# Patient Record
Sex: Female | Born: 1970 | Race: White | Hispanic: No | Marital: Married
Health system: Southern US, Community
[De-identification: ages and names within clinical notes are randomized; demographics above are authoritative.]

## PROBLEM LIST (undated history)

## (undated) HISTORY — PX: ABDOMINAL HYSTERECTOMY: SHX81

## (undated) HISTORY — PX: DIAGNOSTIC LAPAROSCOPY: SUR761

---

## 2004-08-17 ENCOUNTER — Emergency Department: Payer: Self-pay | Admitting: Internal Medicine

## 2005-05-17 ENCOUNTER — Emergency Department: Payer: Self-pay | Admitting: General Practice

## 2006-11-12 ENCOUNTER — Ambulatory Visit: Payer: Self-pay

## 2006-12-25 ENCOUNTER — Inpatient Hospital Stay: Payer: Self-pay | Admitting: Obstetrics and Gynecology

## 2008-08-03 ENCOUNTER — Ambulatory Visit: Payer: Self-pay | Admitting: Obstetrics and Gynecology

## 2008-08-04 ENCOUNTER — Inpatient Hospital Stay: Payer: Self-pay

## 2014-04-03 ENCOUNTER — Ambulatory Visit: Payer: Self-pay | Admitting: Internal Medicine

## 2017-12-20 ENCOUNTER — Other Ambulatory Visit: Payer: Self-pay | Admitting: Obstetrics & Gynecology

## 2017-12-20 DIAGNOSIS — Z1231 Encounter for screening mammogram for malignant neoplasm of breast: Secondary | ICD-10-CM

## 2017-12-25 ENCOUNTER — Ambulatory Visit
Admission: RE | Admit: 2017-12-25 | Discharge: 2017-12-25 | Disposition: A | Discharge status: Home / Self Care | Payer: BLUE CROSS/BLUE SHIELD | Source: Ambulatory Visit | Attending: Obstetrics & Gynecology | Admitting: Obstetrics & Gynecology

## 2017-12-25 ENCOUNTER — Encounter (INDEPENDENT_AMBULATORY_CARE_PROVIDER_SITE_OTHER): Payer: Self-pay

## 2017-12-25 DIAGNOSIS — Z1231 Encounter for screening mammogram for malignant neoplasm of breast: Secondary | ICD-10-CM | POA: Diagnosis present

## 2017-12-30 ENCOUNTER — Other Ambulatory Visit: Payer: Self-pay | Admitting: Obstetrics & Gynecology

## 2017-12-30 DIAGNOSIS — N6489 Other specified disorders of breast: Secondary | ICD-10-CM

## 2017-12-30 DIAGNOSIS — R928 Other abnormal and inconclusive findings on diagnostic imaging of breast: Secondary | ICD-10-CM

## 2018-01-09 ENCOUNTER — Ambulatory Visit
Admission: RE | Admit: 2018-01-09 | Discharge: 2018-01-09 | Disposition: A | Discharge status: Home / Self Care | Payer: BLUE CROSS/BLUE SHIELD | Source: Ambulatory Visit | Attending: Obstetrics & Gynecology | Admitting: Obstetrics & Gynecology

## 2018-01-09 DIAGNOSIS — N6489 Other specified disorders of breast: Secondary | ICD-10-CM | POA: Insufficient documentation

## 2018-01-09 DIAGNOSIS — R928 Other abnormal and inconclusive findings on diagnostic imaging of breast: Secondary | ICD-10-CM

## 2018-01-09 DIAGNOSIS — N6323 Unspecified lump in the left breast, lower outer quadrant: Secondary | ICD-10-CM | POA: Insufficient documentation

## 2018-01-09 DIAGNOSIS — N6321 Unspecified lump in the left breast, upper outer quadrant: Secondary | ICD-10-CM | POA: Diagnosis not present

## 2018-06-11 ENCOUNTER — Encounter: Payer: Self-pay | Admitting: Anesthesiology

## 2018-06-11 ENCOUNTER — Other Ambulatory Visit: Payer: Self-pay

## 2018-06-11 ENCOUNTER — Ambulatory Visit: Payer: BLUE CROSS/BLUE SHIELD | Admitting: Anesthesiology

## 2018-06-11 ENCOUNTER — Observation Stay
Admission: AD | Admit: 2018-06-11 | Discharge: 2018-06-12 | Disposition: A | Discharge status: Home / Self Care | Payer: BLUE CROSS/BLUE SHIELD | Source: Ambulatory Visit | Attending: Obstetrics & Gynecology | Admitting: Obstetrics & Gynecology

## 2018-06-11 ENCOUNTER — Encounter
Admission: AD | Disposition: A | Discharge status: Home / Self Care | Payer: Self-pay | Source: Ambulatory Visit | Attending: Obstetrics & Gynecology

## 2018-06-11 DIAGNOSIS — N8301 Follicular cyst of right ovary: Secondary | ICD-10-CM | POA: Insufficient documentation

## 2018-06-11 DIAGNOSIS — D251 Intramural leiomyoma of uterus: Secondary | ICD-10-CM | POA: Insufficient documentation

## 2018-06-11 DIAGNOSIS — N939 Abnormal uterine and vaginal bleeding, unspecified: Secondary | ICD-10-CM | POA: Diagnosis not present

## 2018-06-11 DIAGNOSIS — R9389 Abnormal findings on diagnostic imaging of other specified body structures: Secondary | ICD-10-CM | POA: Diagnosis present

## 2018-06-11 DIAGNOSIS — N858 Other specified noninflammatory disorders of uterus: Secondary | ICD-10-CM | POA: Diagnosis not present

## 2018-06-11 DIAGNOSIS — N736 Female pelvic peritoneal adhesions (postinfective): Secondary | ICD-10-CM | POA: Insufficient documentation

## 2018-06-11 DIAGNOSIS — N83209 Unspecified ovarian cyst, unspecified side: Secondary | ICD-10-CM | POA: Diagnosis present

## 2018-06-11 DIAGNOSIS — Z6839 Body mass index (BMI) 39.0-39.9, adult: Secondary | ICD-10-CM | POA: Diagnosis not present

## 2018-06-11 DIAGNOSIS — D62 Acute posthemorrhagic anemia: Secondary | ICD-10-CM | POA: Diagnosis present

## 2018-06-11 DIAGNOSIS — Z880 Allergy status to penicillin: Secondary | ICD-10-CM | POA: Insufficient documentation

## 2018-06-11 DIAGNOSIS — N938 Other specified abnormal uterine and vaginal bleeding: Secondary | ICD-10-CM | POA: Diagnosis present

## 2018-06-11 DIAGNOSIS — E669 Obesity, unspecified: Secondary | ICD-10-CM | POA: Diagnosis not present

## 2018-06-11 DIAGNOSIS — Z91040 Latex allergy status: Secondary | ICD-10-CM | POA: Insufficient documentation

## 2018-06-11 HISTORY — PX: LAPAROSCOPIC HYSTERECTOMY: SHX1926

## 2018-06-11 LAB — CBC
HCT: 23.7 % — ABNORMAL LOW (ref 35.0–47.0)
HEMATOCRIT: 26.4 % — AB (ref 35.0–47.0)
Hemoglobin: 7.7 g/dL — ABNORMAL LOW (ref 12.0–16.0)
Hemoglobin: 8.7 g/dL — ABNORMAL LOW (ref 12.0–16.0)
MCH: 23.9 pg — AB (ref 26.0–34.0)
MCH: 25.3 pg — AB (ref 26.0–34.0)
MCHC: 32.4 g/dL (ref 32.0–36.0)
MCHC: 32.7 g/dL (ref 32.0–36.0)
MCV: 73.9 fL — AB (ref 80.0–100.0)
MCV: 77.4 fL — AB (ref 80.0–100.0)
PLATELETS: 276 10*3/uL (ref 150–440)
Platelets: 265 10*3/uL (ref 150–440)
RBC: 3.21 MIL/uL — AB (ref 3.80–5.20)
RBC: 3.42 MIL/uL — AB (ref 3.80–5.20)
RDW: 17.4 % — ABNORMAL HIGH (ref 11.5–14.5)
RDW: 18.7 % — ABNORMAL HIGH (ref 11.5–14.5)
WBC: 7.8 10*3/uL (ref 3.6–11.0)
WBC: 9.6 10*3/uL (ref 3.6–11.0)

## 2018-06-11 LAB — ABO/RH: ABO/RH(D): A POS

## 2018-06-11 LAB — BASIC METABOLIC PANEL
Anion gap: 11 (ref 5–15)
BUN: 16 mg/dL (ref 6–20)
CHLORIDE: 104 mmol/L (ref 98–111)
CO2: 24 mmol/L (ref 22–32)
Calcium: 8.7 mg/dL — ABNORMAL LOW (ref 8.9–10.3)
Creatinine, Ser: 0.75 mg/dL (ref 0.44–1.00)
GFR calc Af Amer: >60 mL/min (ref >60)
GLUCOSE: 118 mg/dL — AB (ref 70–99)
POTASSIUM: 3.7 mmol/L (ref 3.5–5.1)
Sodium: 139 mmol/L (ref 135–145)

## 2018-06-11 LAB — PREPARE RBC (CROSSMATCH)

## 2018-06-11 SURGERY — HYSTERECTOMY, TOTAL, LAPAROSCOPIC
Anesthesia: General | Site: Abdomen | Wound class: Clean Contaminated

## 2018-06-11 MED ORDER — PHENYLEPHRINE HCL 10 MG/ML IJ SOLN
INTRAMUSCULAR | Status: DC | PRN
  Administered 2018-06-11 (×4): 100 ug via INTRAVENOUS

## 2018-06-11 MED ORDER — SUCCINYLCHOLINE CHLORIDE 20 MG/ML IJ SOLN
INTRAMUSCULAR | Status: DC | PRN
  Administered 2018-06-11: 100 mg via INTRAVENOUS

## 2018-06-11 MED ORDER — KETOROLAC TROMETHAMINE 30 MG/ML IJ SOLN
INTRAMUSCULAR | Status: AC
  Filled 2018-06-11: qty 1

## 2018-06-11 MED ORDER — OXYCODONE HCL 5 MG PO TABS: 10.0000 mg | ORAL_TABLET | ORAL | Status: DC | PRN

## 2018-06-11 MED ORDER — LIDOCAINE HCL (CARDIAC) PF 100 MG/5ML IV SOSY
PREFILLED_SYRINGE | INTRAVENOUS | Status: DC | PRN
  Administered 2018-06-11: 30 mg via INTRAVENOUS

## 2018-06-11 MED ORDER — OXYCODONE HCL 5 MG PO TABS: 5.0000 mg | ORAL_TABLET | ORAL | 0 refills | Status: AC | PRN

## 2018-06-11 MED ORDER — PROPOFOL 10 MG/ML IV BOLUS
INTRAVENOUS | Status: DC | PRN
  Administered 2018-06-11: 150 mg via INTRAVENOUS

## 2018-06-11 MED ORDER — GENTAMICIN SULFATE 40 MG/ML IJ SOLN
1.5000 mg/kg | Freq: Once | INTRAVENOUS | Status: AC
  Administered 2018-06-11: 160 mg via INTRAVENOUS
  Filled 2018-06-11: qty 4

## 2018-06-11 MED ORDER — SODIUM CHLORIDE 0.9% IV SOLUTION: Freq: Once | INTRAVENOUS | Status: DC

## 2018-06-11 MED ORDER — ONDANSETRON HCL 4 MG/2ML IJ SOLN: 4.0000 mg | Freq: Once | INTRAMUSCULAR | Status: DC | PRN

## 2018-06-11 MED ORDER — SODIUM CHLORIDE 0.9 % IV SOLN
INTRAVENOUS | Status: DC | PRN
  Administered 2018-06-11: 25 ug/min via INTRAVENOUS

## 2018-06-11 MED ORDER — IBUPROFEN 800 MG PO TABS: 800.0000 mg | ORAL_TABLET | Freq: Four times a day (QID) | ORAL | 1 refills | Status: AC

## 2018-06-11 MED ORDER — SUGAMMADEX SODIUM 200 MG/2ML IV SOLN
INTRAVENOUS | Status: DC | PRN
  Administered 2018-06-11: 210 mg via INTRAVENOUS

## 2018-06-11 MED ORDER — ONDANSETRON HCL 4 MG/2ML IJ SOLN
INTRAMUSCULAR | Status: DC | PRN
  Administered 2018-06-11: 4 mg via INTRAVENOUS

## 2018-06-11 MED ORDER — MENTHOL 3 MG MT LOZG
1.0000 | LOZENGE | OROMUCOSAL | Status: DC | PRN
  Filled 2018-06-11: qty 9

## 2018-06-11 MED ORDER — ONDANSETRON HCL 4 MG/2ML IJ SOLN: 4.0000 mg | Freq: Four times a day (QID) | INTRAMUSCULAR | Status: DC | PRN

## 2018-06-11 MED ORDER — KETOROLAC TROMETHAMINE 30 MG/ML IJ SOLN
30.0000 mg | Freq: Once | INTRAMUSCULAR | Status: AC
  Administered 2018-06-11: 30 mg via INTRAVENOUS

## 2018-06-11 MED ORDER — GABAPENTIN 300 MG PO CAPS
ORAL_CAPSULE | ORAL | Status: AC
  Administered 2018-06-11: 600 mg via ORAL
  Filled 2018-06-11: qty 2

## 2018-06-11 MED ORDER — OXYCODONE HCL 5 MG PO TABS: 5.0000 mg | ORAL_TABLET | ORAL | Status: DC | PRN

## 2018-06-11 MED ORDER — LACTATED RINGERS IV SOLN
INTRAVENOUS | Status: DC | PRN
  Administered 2018-06-11 (×2): via INTRAVENOUS

## 2018-06-11 MED ORDER — FAMOTIDINE 20 MG PO TABS
ORAL_TABLET | ORAL | Status: AC
  Administered 2018-06-11: 20 mg via ORAL
  Filled 2018-06-11: qty 1

## 2018-06-11 MED ORDER — LIDOCAINE HCL (PF) 2 % IJ SOLN
INTRAMUSCULAR | Status: AC
  Filled 2018-06-11: qty 10

## 2018-06-11 MED ORDER — FAMOTIDINE 20 MG PO TABS
20.0000 mg | ORAL_TABLET | Freq: Once | ORAL | Status: AC
  Administered 2018-06-11: 20 mg via ORAL

## 2018-06-11 MED ORDER — GABAPENTIN 300 MG PO CAPS
600.0000 mg | ORAL_CAPSULE | Freq: Once | ORAL | Status: AC
  Administered 2018-06-11: 600 mg via ORAL

## 2018-06-11 MED ORDER — ACETAMINOPHEN 500 MG PO TABS
ORAL_TABLET | ORAL | Status: AC
  Administered 2018-06-11: 1000 mg via ORAL
  Filled 2018-06-11: qty 2

## 2018-06-11 MED ORDER — LACTATED RINGERS IV SOLN
INTRAVENOUS | Status: DC
  Administered 2018-06-11: 18:00:00 via INTRAVENOUS

## 2018-06-11 MED ORDER — FENTANYL CITRATE (PF) 100 MCG/2ML IJ SOLN
INTRAMUSCULAR | Status: AC
  Filled 2018-06-11: qty 4

## 2018-06-11 MED ORDER — ROCURONIUM BROMIDE 50 MG/5ML IV SOLN
INTRAVENOUS | Status: AC
  Filled 2018-06-11: qty 2

## 2018-06-11 MED ORDER — DOCUSATE SODIUM 100 MG PO CAPS
100.0000 mg | ORAL_CAPSULE | Freq: Two times a day (BID) | ORAL | Status: DC
  Administered 2018-06-12 (×2): 100 mg via ORAL
  Filled 2018-06-11 (×2): qty 1

## 2018-06-11 MED ORDER — IBUPROFEN 600 MG PO TABS
600.0000 mg | ORAL_TABLET | Freq: Four times a day (QID) | ORAL | Status: DC
  Administered 2018-06-12 (×2): 600 mg via ORAL
  Filled 2018-06-11 (×2): qty 1

## 2018-06-11 MED ORDER — MIDAZOLAM HCL 2 MG/2ML IJ SOLN
INTRAMUSCULAR | Status: AC
  Filled 2018-06-11: qty 2

## 2018-06-11 MED ORDER — ACETAMINOPHEN 500 MG PO TABS
1000.0000 mg | ORAL_TABLET | Freq: Once | ORAL | Status: AC
  Administered 2018-06-11: 1000 mg via ORAL

## 2018-06-11 MED ORDER — HEPARIN SODIUM (PORCINE) 5000 UNIT/ML IJ SOLN
5000.0000 [IU] | Freq: Once | INTRAMUSCULAR | Status: AC
  Administered 2018-06-11: 5000 [IU] via SUBCUTANEOUS

## 2018-06-11 MED ORDER — CLINDAMYCIN PHOSPHATE 900 MG/50ML IV SOLN
900.0000 mg | Freq: Once | INTRAVENOUS | Status: AC
  Administered 2018-06-11: 900 mg via INTRAVENOUS

## 2018-06-11 MED ORDER — FENTANYL CITRATE (PF) 100 MCG/2ML IJ SOLN: 25.0000 ug | INTRAMUSCULAR | Status: DC | PRN

## 2018-06-11 MED ORDER — ACETAMINOPHEN 500 MG PO TABS
1000.0000 mg | ORAL_TABLET | Freq: Four times a day (QID) | ORAL | Status: DC
  Administered 2018-06-12 (×2): 1000 mg via ORAL
  Filled 2018-06-11 (×2): qty 2

## 2018-06-11 MED ORDER — SODIUM CHLORIDE 0.9 % IV SOLN
50.0000 mL/h | INTRAVENOUS | Status: DC
  Administered 2018-06-12: 50 mL/h via INTRAVENOUS

## 2018-06-11 MED ORDER — MIDAZOLAM HCL 2 MG/2ML IJ SOLN
INTRAMUSCULAR | Status: DC | PRN
  Administered 2018-06-11: 2 mg via INTRAVENOUS

## 2018-06-11 MED ORDER — CEFAZOLIN SODIUM-DEXTROSE 2-4 GM/100ML-% IV SOLN
INTRAVENOUS | Status: AC
  Filled 2018-06-11: qty 100

## 2018-06-11 MED ORDER — ONDANSETRON HCL 4 MG/2ML IJ SOLN
INTRAMUSCULAR | Status: AC
  Filled 2018-06-11: qty 2

## 2018-06-11 MED ORDER — CLINDAMYCIN PHOSPHATE 900 MG/50ML IV SOLN
INTRAVENOUS | Status: AC
  Filled 2018-06-11: qty 50

## 2018-06-11 MED ORDER — ROCURONIUM BROMIDE 100 MG/10ML IV SOLN
INTRAVENOUS | Status: DC | PRN
  Administered 2018-06-11: 50 mg via INTRAVENOUS
  Administered 2018-06-11: 20 mg via INTRAVENOUS

## 2018-06-11 MED ORDER — CEFAZOLIN SODIUM-DEXTROSE 2-4 GM/100ML-% IV SOLN: 2.0000 g | Freq: Once | INTRAVENOUS | Status: DC

## 2018-06-11 MED ORDER — SEVOFLURANE IN SOLN
RESPIRATORY_TRACT | Status: AC
  Filled 2018-06-11: qty 250

## 2018-06-11 MED ORDER — SIMETHICONE 80 MG PO CHEW: 160.0000 mg | CHEWABLE_TABLET | Freq: Four times a day (QID) | ORAL | Status: DC | PRN

## 2018-06-11 MED ORDER — CELECOXIB 200 MG PO CAPS
ORAL_CAPSULE | ORAL | Status: AC
  Administered 2018-06-11: 400 mg via ORAL
  Filled 2018-06-11: qty 2

## 2018-06-11 MED ORDER — HYDROMORPHONE HCL 1 MG/ML IJ SOLN: 0.2000 mg | INTRAMUSCULAR | Status: DC | PRN

## 2018-06-11 MED ORDER — SODIUM CHLORIDE 0.9 % IV SOLN
INTRAVENOUS | Status: DC | PRN
  Administered 2018-06-11 (×2): via INTRAVENOUS

## 2018-06-11 MED ORDER — CELECOXIB 200 MG PO CAPS
400.0000 mg | ORAL_CAPSULE | Freq: Once | ORAL | Status: AC
  Administered 2018-06-11: 400 mg via ORAL

## 2018-06-11 MED ORDER — HEPARIN SODIUM (PORCINE) 5000 UNIT/ML IJ SOLN
INTRAMUSCULAR | Status: AC
  Administered 2018-06-11: 5000 [IU] via SUBCUTANEOUS
  Filled 2018-06-11: qty 1

## 2018-06-11 MED ORDER — ONDANSETRON HCL 4 MG PO TABS: 4.0000 mg | ORAL_TABLET | Freq: Four times a day (QID) | ORAL | Status: DC | PRN

## 2018-06-11 MED ORDER — FENTANYL CITRATE (PF) 100 MCG/2ML IJ SOLN
INTRAMUSCULAR | Status: AC
  Filled 2018-06-11: qty 2

## 2018-06-11 MED ORDER — FENTANYL CITRATE (PF) 100 MCG/2ML IJ SOLN
INTRAMUSCULAR | Status: DC | PRN
  Administered 2018-06-11: 50 ug via INTRAVENOUS
  Administered 2018-06-11: 100 ug via INTRAVENOUS
  Administered 2018-06-11: 50 ug via INTRAVENOUS
  Administered 2018-06-11: 25 ug via INTRAVENOUS

## 2018-06-11 SURGICAL SUPPLY — 57 items
BACTOSHIELD CHG 4% 4OZ (MISCELLANEOUS) ×1
BAG URINE DRAINAGE (UROLOGICAL SUPPLIES) ×3 IMPLANT
BARRIER ADHS 3X4 INTERCEED (GAUZE/BANDAGES/DRESSINGS) ×3 IMPLANT
BLADE SURG SZ11 CARB STEEL (BLADE) ×3 IMPLANT
CANISTER SUCT 1200ML W/VALVE (MISCELLANEOUS) ×3 IMPLANT
CATH FOLEY 2WAY  5CC 16FR (CATHETERS) ×2
CATH URTH 16FR FL 2W BLN LF (CATHETERS) ×1 IMPLANT
CHLORAPREP W/TINT 26ML (MISCELLANEOUS) ×6 IMPLANT
DEFOGGER SCOPE WARMER CLEARIFY (MISCELLANEOUS) ×3 IMPLANT
DERMABOND ADVANCED (GAUZE/BANDAGES/DRESSINGS) ×2
DERMABOND ADVANCED .7 DNX12 (GAUZE/BANDAGES/DRESSINGS) ×1 IMPLANT
DEVICE SUTURE ENDOST 10MM (ENDOMECHANICALS) IMPLANT
DRAPE LEGGINS SURG 28X43 STRL (DRAPES) ×3 IMPLANT
DRAPE SHEET LG 3/4 BI-LAMINATE (DRAPES) ×3 IMPLANT
DRAPE UNDER BUTTOCK W/FLU (DRAPES) ×3 IMPLANT
ELECT REM PT RETURN 9FT ADLT (ELECTROSURGICAL) ×3
ELECTRODE REM PT RTRN 9FT ADLT (ELECTROSURGICAL) ×1 IMPLANT
GLOVE BIOGEL PI IND STRL 7.5 (GLOVE) ×3 IMPLANT
GLOVE BIOGEL PI INDICATOR 7.5 (GLOVE) ×6
GLOVE PI ORTHOPRO 6.5 (GLOVE) ×2
GLOVE PI ORTHOPRO STRL 6.5 (GLOVE) ×1 IMPLANT
GLOVE SURG SYN 6.5 ES PF (GLOVE) ×18 IMPLANT
GOWN STRL REUS W/ TWL LRG LVL3 (GOWN DISPOSABLE) ×2 IMPLANT
GOWN STRL REUS W/ TWL XL LVL3 (GOWN DISPOSABLE) ×1 IMPLANT
GOWN STRL REUS W/TWL LRG LVL3 (GOWN DISPOSABLE) ×4
GOWN STRL REUS W/TWL XL LVL3 (GOWN DISPOSABLE) ×2
IRRIGATION STRYKERFLOW (MISCELLANEOUS) ×1 IMPLANT
IRRIGATOR STRYKERFLOW (MISCELLANEOUS) ×3
IV LACTATED RINGERS 1000ML (IV SOLUTION) ×3 IMPLANT
KIT PINK PAD W/HEAD ARE REST (MISCELLANEOUS) ×3
KIT PINK PAD W/HEAD ARM REST (MISCELLANEOUS) ×1 IMPLANT
KIT TURNOVER CYSTO (KITS) ×3 IMPLANT
L-HOOK LAP DISP 36CM (ELECTROSURGICAL)
LHOOK LAP DISP 36CM (ELECTROSURGICAL) IMPLANT
LIGASURE VESSEL 5MM BLUNT TIP (ELECTROSURGICAL) ×3 IMPLANT
MANIPULATOR VCARE LG CRV RETR (MISCELLANEOUS) IMPLANT
MANIPULATOR VCARE SML CRV RETR (MISCELLANEOUS) IMPLANT
MANIPULATOR VCARE STD CRV RETR (MISCELLANEOUS) ×3 IMPLANT
NS IRRIG 500ML POUR BTL (IV SOLUTION) ×3 IMPLANT
PACK LAP CHOLECYSTECTOMY (MISCELLANEOUS) ×3 IMPLANT
PAD OB MATERNITY 4.3X12.25 (PERSONAL CARE ITEMS) ×3 IMPLANT
PAD PREP 24X41 OB/GYN DISP (PERSONAL CARE ITEMS) ×3 IMPLANT
PENCIL ELECTRO HAND CTR (MISCELLANEOUS) ×3 IMPLANT
SCRUB CHG 4% DYNA-HEX 4OZ (MISCELLANEOUS) ×2 IMPLANT
SET CYSTO W/LG BORE CLAMP LF (SET/KITS/TRAYS/PACK) IMPLANT
SET YANKAUER POOLE SUCT (MISCELLANEOUS) ×3 IMPLANT
SLEEVE ENDOPATH XCEL 5M (ENDOMECHANICALS) ×6 IMPLANT
SURGILUBE 2OZ TUBE FLIPTOP (MISCELLANEOUS) ×3 IMPLANT
SUT ENDO VLOC 180-0-8IN (SUTURE) IMPLANT
SUT MNCRL 4-0 (SUTURE) ×2
SUT MNCRL 4-0 27XMFL (SUTURE) ×1
SUT VIC AB 0 CT1 36 (SUTURE) IMPLANT
SUT VIC AB 2-0 CT1 27 (SUTURE) ×6
SUT VIC AB 2-0 CT1 TAPERPNT 27 (SUTURE) ×3 IMPLANT
SUTURE MNCRL 4-0 27XMF (SUTURE) ×1 IMPLANT
TROCAR XCEL NON-BLD 5MMX100MML (ENDOMECHANICALS) ×3 IMPLANT
TUBING INSUF HEATED (TUBING) ×3 IMPLANT

## 2018-06-11 NOTE — Anesthesia Preprocedure Evaluation (Signed)
Anesthesia Evaluation  Patient identified by MRN, date of birth, ID band Patient awake    Reviewed: Allergy & Precautions, NPO status , Patient's Chart, lab work & pertinent test results, reviewed documented beta blocker date and time   Airway Mallampati: III  TM Distance: >3 FB     Dental  (+) Chipped   Pulmonary           Cardiovascular      Neuro/Psych    GI/Hepatic   Endo/Other    Renal/GU      Musculoskeletal   Abdominal   Peds  Hematology   Anesthesia Other Findings Obese.  Reproductive/Obstetrics                             Anesthesia Physical Anesthesia Plan  ASA: III  Anesthesia Plan: General   Post-op Pain Management:    Induction: Intravenous  PONV Risk Score and Plan:   Airway Management Planned: Oral ETT  Additional Equipment:   Intra-op Plan:   Post-operative Plan:   Informed Consent: I have reviewed the patients History and Physical, chart, labs and discussed the procedure including the risks, benefits and alternatives for the proposed anesthesia with the patient or authorized representative who has indicated his/her understanding and acceptance.     Plan Discussed with: CRNA  Anesthesia Plan Comments:         Anesthesia Quick Evaluation

## 2018-06-11 NOTE — Op Note (Signed)
Total Laparoscopic Hysterectomy Operative Note Procedure Date: 06/11/2018  Patient:  Breanna Lin  47 y.o. female  PRE-OPERATIVE DIAGNOSIS:  Uterine hemorrhage, thickened endometrium  POST-OPERATIVE DIAGNOSIS:  same  PROCEDURE:  Procedure(s): HYSTERECTOMY TOTAL LAPAROSCOPIC, bilateral salpingectomy, right ovarian cystectomy, lysis of adhesions (N/A)  SURGEON:  Surgeon(s) and Role:    * Ward, Honor Loh, MD - Primary  ANESTHESIA:  General via ET  I/O  Total I/O In: 2850 [I.V.:2500; Blood:350] Out: 400 [Urine:250; Blood:150]  FINDINGS: very narrow pelvic outlet. Enlarged uterus adherent to anterior abdominal wall, bladder adhesions to anterior uterus, left ovary adherent to left pelvic side wall near cervix, left fallopian tube adherent to left ovary.  Right ovary with simple cyst at apex, clear fluid, normal appearing fallopian tube.  Normal upper abdomen. Normal appendix.  The bowel did not retract into the upper abdomen in trendelenberg.   SPECIMEN: Uterus, Cervix, bilateral fallopian tubes, and right ovarian cyst wall.  COMPLICATIONS: none apparent  DISPOSITION: vital signs stable to PACU  Indication for Surgery: 46 y.o. with history of menorrhagia who presented with copious bleeding from the uterus, passing large clots, and found to have an endometrial thickening of 3cm.  She was being worked up for a hysterectomy, and requested immediate surgery.  Risks of surgery were discussed with the patient including but not limited to: bleeding which may require transfusion or reoperation; infection which may require antibiotics; injury to bowel, bladder, ureters or other surrounding organs; need for additional procedures including laparotomy, blood clot, incisional problems and other postoperative/anesthesia complications. Written informed consent was obtained.    Preop hemoglobin was 7.7; 1unit of PRBCs was transfused in the OR.   PROCEDURE IN DETAIL:  The patient had 5000u Heparin  Sub-q and sequential compression devices applied to her lower extremities while in the preoperative area.  She was then taken to the operating room. IV antibiotics were given. General anesthesia was administered via endotracheal route.  She was placed in the dorsal lithotomy position, and was prepped and draped in a sterile manner. A surgical time-out was performed.  A Foley catheter was inserted into her bladder and attached to constant drainage and a V-Care uterine manipulator was then advanced into the uterus and a good fit around the cervix was noted. The gloves were changed, and attention was turned to the abdomen where an umbilical incision was made with the scalpel.  A 77m trochar was inserted in the umbilical incision using a visiport method.Opening pressure was 793mg, and the abdomen was insufflated to 1564m carbon dioxide gas and adequate pneumoperitoneum was obtained. A survey of the patient's pelvis and abdomen revealed the findings as mentioned above. Two 5mm24mrts were inserted in the lower left and right quadrants under visualization.    The uterine adhesions to anterior abdominal wall were dissected down with the laparoscopic bovie. The bladder was encountered and separated from the uterus in the same manner.  Due to the narrow pelvic outlet, the bilateral fallopian tubes were separated at the cornua using the Ligasure. The bilateral round ligaments were transected and anterior broad ligament divided and brought across the uterus to separate the vesicouterine peritoneum and create a bladder flap. The bladder was pushed away from the uterus. The bilateral uterine arteries were skeletonized, ligated and transected. The bilateral uterosacral and cardinal ligaments were ligated and transected. A colpotomy was made around the V-Care cervical cup and the uterus and cervix were removed through the vagina. The vaginal cuff was closed vaginally using 0-Vicryl in a  running locking stitch. This was  tested for integrity using the surgeon's finger. After a change of gloves, the pneumoperitoneum was recreated and surgical site inspected, and found to be hemostatic. Bilateral ureters were visualized vermiuclating. The bilateral fallopian tubes were divided along the mesosalpinx and removed through the trochars.  The right ovarian cyst was being bluntly separated from the ovarian tissue when it was ruptured, and drained for clear fluid,  The cyst wall was removed in parts. This site was hemostatic.  Interceed was placed along the vaginal cuff to hopefully prevent bowel adhesions.  No intraoperative injury to surrounding organs was noted. The abdomen was desufflated and all instruments were then removed.   All skin incisions were closed with 4-0 monocryl and covered with surgical glue.   The vaginal cuff was re-inspected and found to be bleeding from a suture site.  A second running stitch of 2-0 vicryl was placed and then the site was hemostatic.   The patient tolerated the procedures well.  All instruments, needles, and sponge counts were correct x 2. The patient was taken to the recovery room in stable condition.   ---- Larey Days, MD Attending Obstetrician and [facility]

## 2018-06-11 NOTE — Anesthesia Procedure Notes (Signed)
Procedure Name: Intubation Date/Time: 06/11/2018 6:22 PM Performed by: Lendon Colonel, CRNA Pre-anesthesia Checklist: Patient identified, Patient being monitored, Timeout performed, Emergency Drugs available and Suction available Patient Re-evaluated:Patient Re-evaluated prior to induction Oxygen Delivery Method: Circle system utilized Preoxygenation: Pre-oxygenation with 100% oxygen Induction Type: IV induction, Rapid sequence and Cricoid Pressure applied Laryngoscope Size: Miller and 2 Grade View: Grade II Tube type: Oral Tube size: 7.0 mm Number of attempts: 1 Airway Equipment and Method: Stylet Placement Confirmation: ETT inserted through vocal cords under direct vision,  positive ETCO2 and breath sounds checked- equal and bilateral Secured at: 19 cm Tube secured with: Tape Dental Injury: Teeth and Oropharynx as per pre-operative assessment

## 2018-06-11 NOTE — Transfer of Care (Signed)
Immediate Anesthesia Transfer of Care Note  Patient: Breanna Lin  Procedure(s) Performed: HYSTERECTOMY TOTAL LAPAROSCOPIC, bilateral salpingectomy, right ovarian cystectomy, lysis of adhesions (N/A Abdomen)  Patient Location: PACU  Anesthesia Type:General  Level of Consciousness: oriented and patient cooperative  Airway & Oxygen Therapy: Patient Spontanous Breathing and Patient connected to face mask oxygen  Post-op Assessment: Report given to RN and Post -op Vital signs reviewed and stable  Post vital signs: Reviewed and stable  Last Vitals:  Vitals Value Taken Time  BP 116/81 06/11/2018  9:46 PM  Temp    Pulse 107 06/11/2018  9:53 PM  Resp 16 06/11/2018  9:53 PM  SpO2 100 % 06/11/2018  9:53 PM  Vitals shown include unvalidated device data.  Last Pain:  Vitals:   06/11/18 2146  TempSrc:   PainSc: Asleep         Complications: No apparent anesthesia complications

## 2018-06-11 NOTE — H&P (Signed)
Preoperative History and Physical  Breanna Lin is a 47 y.o.  Who has been bleeding for almost 5 weeks, who started her last period in July and has not stopped. ~3 days ago began getting heavier (saturating pad q2hr) with clots.  EMB in March that was benign, but no follow up. She called earlier this month and asked for hysterectomy; she was going to be brought in next week for evaluation and discussion, but called today with her significant change in bleeding status.  Ultrasound today in office showed endometrial thickening to 3cm and hemorrhage.  And ovarian cyst of 3cm  Denies weight loss or weight gain, no change in appetite. Also about 3 weeks ago she says she has been experiencing abdominal pressure/bloating.   No significant preoperative concerns.  Proposed surgery: TLH BS with ovarian cystectomy  History reviewed. No pertinent past medical history. Past Surgical History:  Procedure Laterality Date  . CESAREAN SECTION    . DIAGNOSTIC LAPAROSCOPY     OB History  No data available  Patient denies any other pertinent gynecologic issues.   No current facility-administered medications on file prior to encounter.    Current Outpatient Medications on File Prior to Encounter  Medication Sig Dispense Refill  . ibuprofen (ADVIL,MOTRIN) 200 MG tablet Take 200 mg by mouth every 6 (six) hours as needed.     Allergies  Allergen Reactions  . Penicillins Itching  . Latex Rash    Social History:     Family History  Problem Relation Age of Onset  . Breast cancer Mother 5  . Breast cancer Maternal Aunt        mat great aunt  maternal great grandmother had colon cancer  Review of Systems: Noncontributory  PHYSICAL EXAM: Blood pressure (!) 150/70, pulse (!) 125, temperature (!) 97.4 F (36.3 C), temperature source Tympanic, resp. rate 16, height 5' 4"  (1.626 m), weight 104.3 kg, last menstrual period 06/11/2018, SpO2 99 %. General appearance - alert, well appearing, and in no  distress Chest - clear to auscultation, no wheezes, rales or rhonchi, symmetric air entry Heart - normal rate and regular rhythm Abdomen - soft, nontender, nondistended, no masses or organomegaly Pelvic - examination not indicated Extremities - peripheral pulses normal, no pedal edema, no clubbing or cyanosis  Labs: Results for orders placed or performed during the hospital encounter of 06/11/18 (from the past 336 hour(s))  Type and screen All Cardiac and thoracic surgeries, spinal fusions, myomectomies, craniotomies, colon & liver resections, total joint revisions, same day c-section with placenta previa or accreta.   Collection Time: 06/11/18  4:51 PM  Result Value Ref Range   ABO/RH(D) PENDING    Antibody Screen PENDING    Sample Expiration      06/14/2018 Performed at [facility], [address] [place], [place] [address]   Type and screen [place]   Collection Time: 06/11/18  5:02 PM  Result Value Ref Range   ABO/RH(D) PENDING    Antibody Screen PENDING    Sample Expiration      06/14/2018 Performed at [facility], [address] [place], [place] [address]     Imaging Studies: No results found.  Assessment: Patient Active Problem List   Diagnosis Date Noted  . Thickened endometrium 06/11/2018  . DUB (dysfunctional uterine bleeding) 06/11/2018  . Ovarian cyst 06/11/2018    Plan: Patient will undergo surgical management with TLH BS with ovarian cystectomy.   The risks of surgery were discussed in  detail with the patient including but not limited to: bleeding which may require transfusion or reoperation; infection which may require antibiotics; injury to surrounding organs which may involve bowel, bladder, ureters ; need for additional procedures including laparoscopy or laparotomy; thromboembolic phenomenon, surgical site problems and other postoperative/anesthesia complications. Likelihood of success in alleviating the  patient's condition was discussed. Routine postoperative instructions will be reviewed with the patient and her family in detail after surgery.  The patient concurred with the proposed plan, giving informed written consent for the surgery.  Patient has been NPO since last night she will remain NPO for procedure.  Anesthesia and OR aware.  Preoperative prophylactic antibiotics and SCDs ordered on call to the OR.  To OR when ready.  ----- Larey Days, MD Attending Obstetrician and Gynecologist [facility], Department of [facility]

## 2018-06-11 NOTE — Anesthesia Post-op Follow-up Note (Signed)
Anesthesia QCDR form completed.        

## 2018-06-12 ENCOUNTER — Encounter: Payer: Self-pay | Admitting: Obstetrics & Gynecology

## 2018-06-12 DIAGNOSIS — N939 Abnormal uterine and vaginal bleeding, unspecified: Secondary | ICD-10-CM | POA: Diagnosis not present

## 2018-06-12 LAB — BPAM RBC
Blood Product Expiration Date: 201909212359
Blood Product Expiration Date: 201909212359
ISSUE DATE / TIME: 201908281847
ISSUE DATE / TIME: 201908282227
UNIT TYPE AND RH: 6200
Unit Type and Rh: 6200

## 2018-06-12 LAB — CBC
HCT: 24.3 % — ABNORMAL LOW (ref 35.0–47.0)
Hemoglobin: 8.1 g/dL — ABNORMAL LOW (ref 12.0–16.0)
MCH: 25.8 pg — ABNORMAL LOW (ref 26.0–34.0)
MCHC: 33.2 g/dL (ref 32.0–36.0)
MCV: 77.5 fL — ABNORMAL LOW (ref 80.0–100.0)
PLATELETS: 226 10*3/uL (ref 150–440)
RBC: 3.14 MIL/uL — AB (ref 3.80–5.20)
RDW: 19.6 % — ABNORMAL HIGH (ref 11.5–14.5)
WBC: 7.8 10*3/uL (ref 3.6–11.0)

## 2018-06-12 LAB — BASIC METABOLIC PANEL
Anion gap: 5 (ref 5–15)
BUN: 10 mg/dL (ref 6–20)
CO2: 25 mmol/L (ref 22–32)
Calcium: 7.7 mg/dL — ABNORMAL LOW (ref 8.9–10.3)
Chloride: 109 mmol/L (ref 98–111)
Creatinine, Ser: 0.7 mg/dL (ref 0.44–1.00)
Glucose, Bld: 112 mg/dL — ABNORMAL HIGH (ref 70–99)
POTASSIUM: 3.7 mmol/L (ref 3.5–5.1)
SODIUM: 139 mmol/L (ref 135–145)

## 2018-06-12 LAB — TYPE AND SCREEN
ABO/RH(D): A POS
ANTIBODY SCREEN: NEGATIVE
UNIT DIVISION: 0
Unit division: 0

## 2018-06-12 LAB — APTT: aPTT: 28 seconds (ref 24–36)

## 2018-06-12 LAB — FIBRINOGEN: FIBRINOGEN: 271 mg/dL (ref 210–475)

## 2018-06-12 LAB — PROTIME-INR
INR: 1.05
Prothrombin Time: 13.6 seconds (ref 11.4–15.2)

## 2018-06-12 MED ORDER — FERROUS SULFATE 325 (65 FE) MG PO TABS: 325.0000 mg | ORAL_TABLET | Freq: Two times a day (BID) | ORAL | 3 refills | Status: AC

## 2018-06-12 MED ORDER — DOCUSATE SODIUM 100 MG PO CAPS: 100.0000 mg | ORAL_CAPSULE | Freq: Two times a day (BID) | ORAL | 0 refills | Status: AC

## 2018-06-12 NOTE — Discharge Instructions (Signed)
Discharge instructions:  Call office if you have any of the following: fever >101 F, chills, excessive vaginal bleeding, incision drainage or problems, leg pain or redness, or any other concerns.   Activity: Do not lift > 10 lbs for 8 weeks.  No intercourse or tampons for 8 weeks.  No driving for 1-2 weeks.   You may feel some pain in your upper right abdomen/rib and right shoulder.  This is from the gas in the abdomen for surgery. This will subside over time, please be patient!  Take 849m Ibuprofen and 10028mTylenol around the clock, every 6 hours for at least the first 3-5 days.  After this you can take as needed.  This will help decrease inflammation and promote healing.  The narcotics you'll take just as needed, as they just trick your brain into thinking its not in pain.    Please don't limit yourself in terms of routine activity.  You will be able to do most things, although they may take longer to do or be a little painful.  You can do it!  Don't be a hero, but don't be a wimp either!

## 2018-06-12 NOTE — Progress Notes (Signed)
Spoke with Dr. Leonides Schanz to clarify discharge order for Motrin- Patient to take Motrin 800 mg every 6 hours at home.

## 2018-06-12 NOTE — Progress Notes (Signed)
Continuing blood transfusion that was started in the PACU. Pt states no pain at this time.

## 2018-06-12 NOTE — Anesthesia Postprocedure Evaluation (Signed)
Anesthesia Post Note  Patient: Breanna Lin  Procedure(s) Performed: HYSTERECTOMY TOTAL LAPAROSCOPIC, bilateral salpingectomy, right ovarian cystectomy, lysis of adhesions (N/A Abdomen)  Patient location during evaluation: PACU Anesthesia Type: General Level of consciousness: awake and alert Pain management: pain level controlled Vital Signs Assessment: post-procedure vital signs reviewed and stable Respiratory status: spontaneous breathing, nonlabored ventilation, respiratory function stable and patient connected to nasal cannula oxygen Cardiovascular status: blood pressure returned to baseline and stable Postop Assessment: no apparent nausea or vomiting Anesthetic complications: no     Last Vitals:  Vitals:   06/12/18 0427 06/12/18 0428  BP: (!) 115/49 116/70  Pulse: (!) 101   Resp: 18   Temp: 37.1 C   SpO2: 97%     Last Pain:  Vitals:   06/12/18 0611  TempSrc:   PainSc: 0-No pain                 Geanine Vandekamp S

## 2018-06-12 NOTE — Progress Notes (Signed)
Patient discharged home with husband. Discharge instructions and prescriptions given and reviewed with patient. VSS, scant bleeding, up ambulating independently- no c/o dizziness or SOB. Patient verbalized understanding. Will be escorted out by auxillary.

## 2018-06-12 NOTE — Discharge Summary (Signed)
Gynecology Physician Postoperative Discharge Summary  Patient ID: Breanna Lin MRN: [id number] DOB/AGE: May 28, 1971 47 y.o.  Admit Date: 06/11/2018 Discharge Date: 06/12/2018  Preoperative Diagnoses:  Uterine hemorrhage, thickened endometrium, DUB, ovarian cyst  Procedures: Procedure(s) (LRB): HYSTERECTOMY TOTAL LAPAROSCOPIC, bilateral salpingectomy, right ovarian cystectomy, lysis of adhesions (N/A)  CBC Latest Ref Rng & Units 06/12/2018 06/11/2018 06/11/2018  WBC 3.6 - 11.0 K/uL 7.8 9.6 7.8  Hemoglobin 12.0 - 16.0 g/dL 8.1(L) 8.7(L) 7.7(L)  Hematocrit 35.0 - 47.0 % 24.3(L) 26.4(L) 23.7(L)  Platelets 150 - 440 K/uL 226 265 276    Hospital Course:  Breanna Lin is a 47 y.o.  Presented to my office with copious bleeding from the vagina, worsening over the last 3 days.  She was directly admitted to the OR for previously discussed and in-the-process-of-scheduling a hysterectomy.   She underwent the procedures as mentioned above, her operation was uncomplicated. For further details about surgery, please refer to the operative report. Her admitting hemoglobin was 7.7 which was suspected to be concentrated, and she received 1 unit of PRBCs during surgery.  Postop, her Hb rose to 8.7 and I decided to give another unit overnight.  Her Hb after the 2nd unit was 8.1.  Patient had an uncomplicated postoperative course. By time of discharge on POD#1, her pain was controlled on oral pain medications; she was ambulating, voiding without difficulty, tolerating regular diet and passing flatus. She was deemed stable for discharge to home.   Discharge Exam: Blood pressure (!) 110/50, pulse 96, temperature 98.4 F (36.9 C), temperature source Oral, resp. rate 18, height 5' 4"  (1.626 m), weight 104.3 kg, last menstrual period 06/11/2018, SpO2 98 %. General appearance: alert and no distress  Resp: clear to auscultation bilaterally, normal respiratory effort Cardio: regular rate and rhythm  GI: soft,  non-tender; bowel sounds normal; no masses, no organomegaly.  Incisions: C/D/I, no erythema, no drainage noted Pelvic: scant blood on pad  Extremities: extremities normal, atraumatic, no cyanosis or edema and Homans sign is negative, no sign of DVT  Discharged Condition: Stable  Disposition: Discharge disposition: 01-Home or Self Care       Discharge Instructions    Diet - low sodium heart healthy   Complete by:  As directed    Increase activity slowly   Complete by:  As directed      Allergies as of 06/12/2018      Reactions   Penicillins Itching   Latex Rash      Medication List    TAKE these medications   docusate sodium 100 MG capsule Commonly known as:  COLACE Take 1 capsule (100 mg total) by mouth 2 (two) times daily.   ferrous sulfate 325 (65 FE) MG tablet Take 1 tablet (325 mg total) by mouth 2 (two) times daily with a meal.   ibuprofen 200 MG tablet Commonly known as:  ADVIL,MOTRIN Take 200 mg by mouth every 6 (six) hours as needed. What changed:  Another medication with the same name was added. Make sure you understand how and when to take each.   ibuprofen 800 MG tablet Commonly known as:  ADVIL,MOTRIN Take 1 tablet (800 mg total) by mouth every 6 (six) hours. What changed:  You were already taking a medication with the same name, and this prescription was added. Make sure you understand how and when to take each.   oxyCODONE 5 MG immediate release tablet Commonly known as:  Oxy IR/ROXICODONE Take 1 tablet (5 mg total) by mouth every  4 (four) hours as needed.      Follow-up Information    Breanna Lin, Breanna Loh, MD Follow up in 2 week(s).   Specialty:  Obstetrics and Gynecology Contact information: [place] [address] [phone]           Signed:  [place]wyn Attending [facility]

## 2018-06-13 LAB — SURGICAL PATHOLOGY

## 2018-08-12 ENCOUNTER — Other Ambulatory Visit: Payer: Self-pay | Admitting: Obstetrics & Gynecology

## 2018-08-12 DIAGNOSIS — N6320 Unspecified lump in the left breast, unspecified quadrant: Secondary | ICD-10-CM

## 2018-08-12 DIAGNOSIS — N6489 Other specified disorders of breast: Secondary | ICD-10-CM

## 2018-09-02 ENCOUNTER — Encounter: Payer: Self-pay | Admitting: Radiology

## 2018-09-02 ENCOUNTER — Ambulatory Visit
Admission: RE | Admit: 2018-09-02 | Discharge: 2018-09-02 | Disposition: A | Discharge status: Home / Self Care | Payer: BLUE CROSS/BLUE SHIELD | Source: Ambulatory Visit | Attending: Obstetrics & Gynecology | Admitting: Obstetrics & Gynecology

## 2018-09-02 DIAGNOSIS — N6489 Other specified disorders of breast: Secondary | ICD-10-CM | POA: Insufficient documentation

## 2018-09-02 DIAGNOSIS — N6320 Unspecified lump in the left breast, unspecified quadrant: Secondary | ICD-10-CM

## 2019-03-30 ENCOUNTER — Other Ambulatory Visit: Payer: Self-pay | Admitting: Obstetrics & Gynecology

## 2019-03-30 DIAGNOSIS — N6489 Other specified disorders of breast: Secondary | ICD-10-CM

## 2019-03-30 DIAGNOSIS — N6320 Unspecified lump in the left breast, unspecified quadrant: Secondary | ICD-10-CM

## 2019-03-31 ENCOUNTER — Other Ambulatory Visit: Payer: Self-pay | Admitting: Obstetrics & Gynecology

## 2019-03-31 DIAGNOSIS — N6489 Other specified disorders of breast: Secondary | ICD-10-CM

## 2019-03-31 DIAGNOSIS — N6320 Unspecified lump in the left breast, unspecified quadrant: Secondary | ICD-10-CM

## 2019-04-10 ENCOUNTER — Ambulatory Visit
Admission: RE | Admit: 2019-04-10 | Discharge: 2019-04-10 | Disposition: A | Discharge status: Home / Self Care | Payer: BC Managed Care – PPO | Source: Ambulatory Visit | Attending: Obstetrics & Gynecology | Admitting: Obstetrics & Gynecology

## 2019-04-10 ENCOUNTER — Other Ambulatory Visit: Payer: Self-pay

## 2019-04-10 DIAGNOSIS — N6320 Unspecified lump in the left breast, unspecified quadrant: Secondary | ICD-10-CM | POA: Insufficient documentation

## 2019-04-10 DIAGNOSIS — N6489 Other specified disorders of breast: Secondary | ICD-10-CM | POA: Diagnosis not present

## 2019-11-14 IMAGING — MG DIGITAL DIAGNOSTIC BILATERAL MAMMOGRAM WITH TOMO AND CAD
8 series · 8 of 24 positions shown · non-contrast
Comparison: Previous exam(s).

CLINICAL DATA: Follow-up of probably of left breast 3 o'clock mass.

EXAM:
DIGITAL DIAGNOSTIC BILATERAL MAMMOGRAM WITH CAD AND TOMO
ULTRASOUND LEFT BREAST

[R MLO synth-2D]
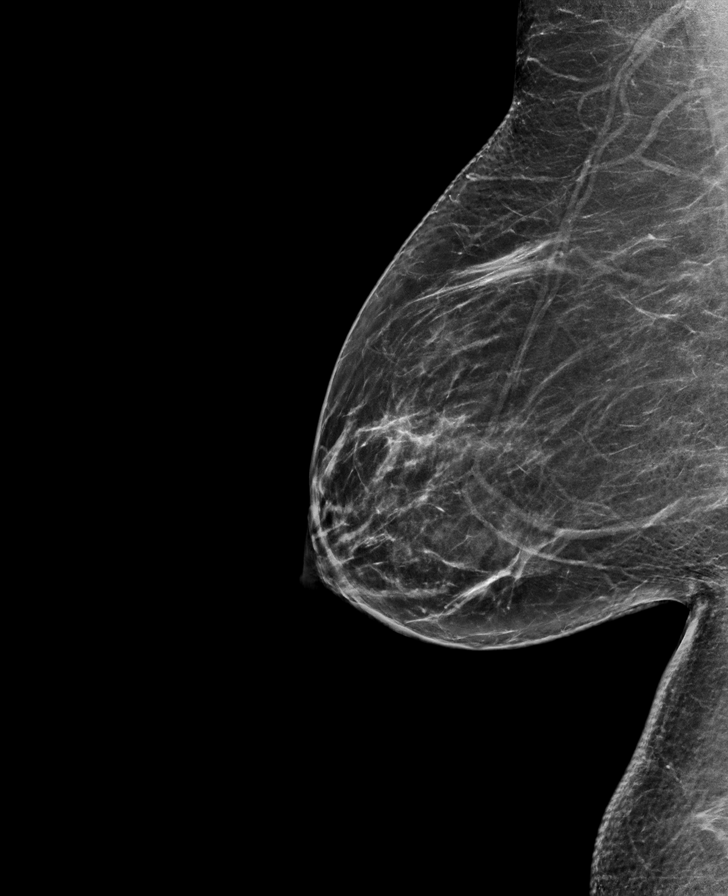

[L MLO synth-2D]
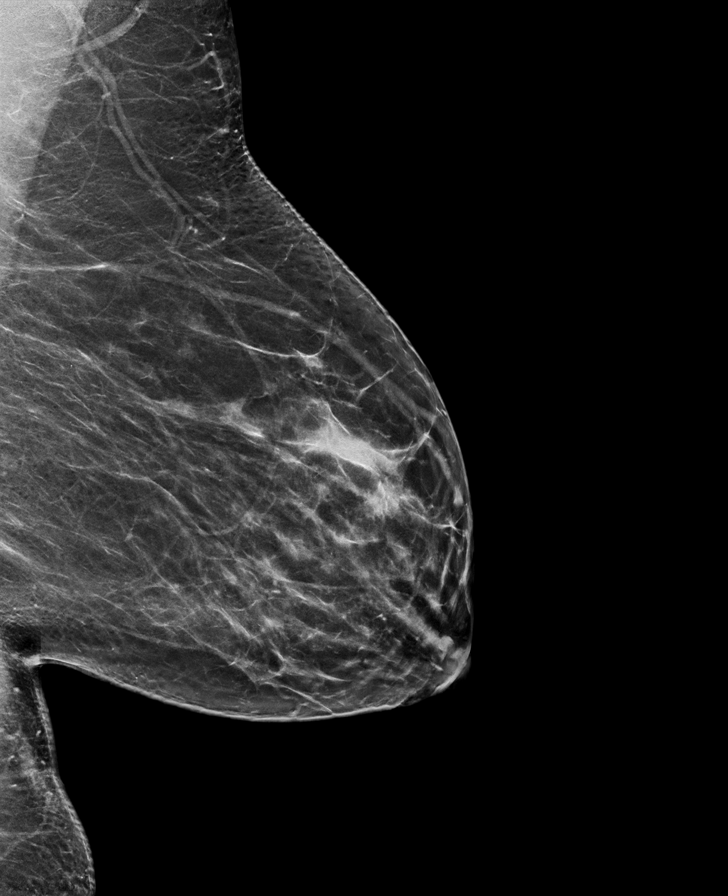

[L CC synth-2D]
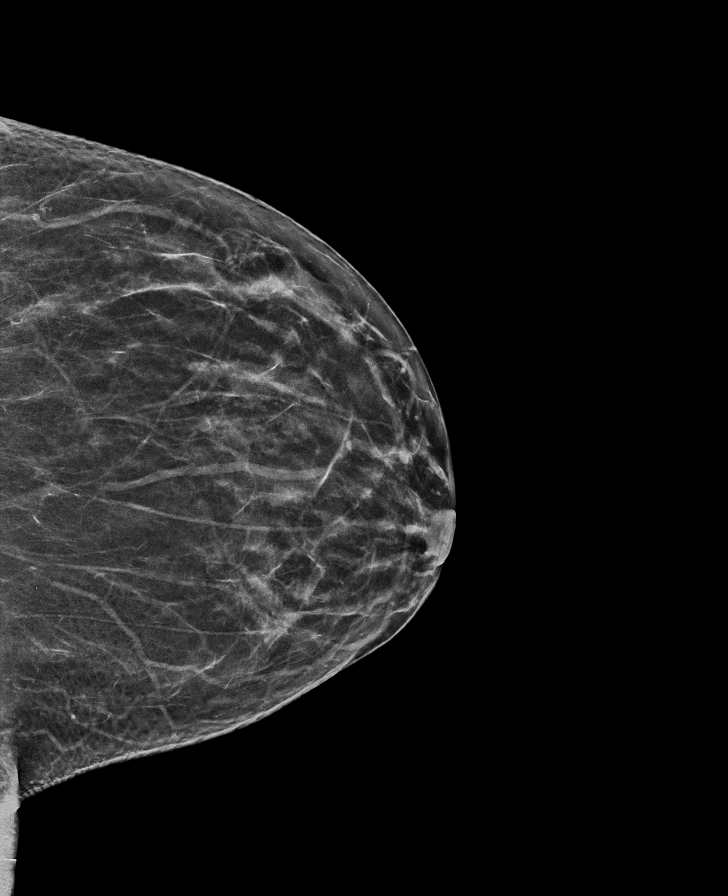

[R CC synth-2D]
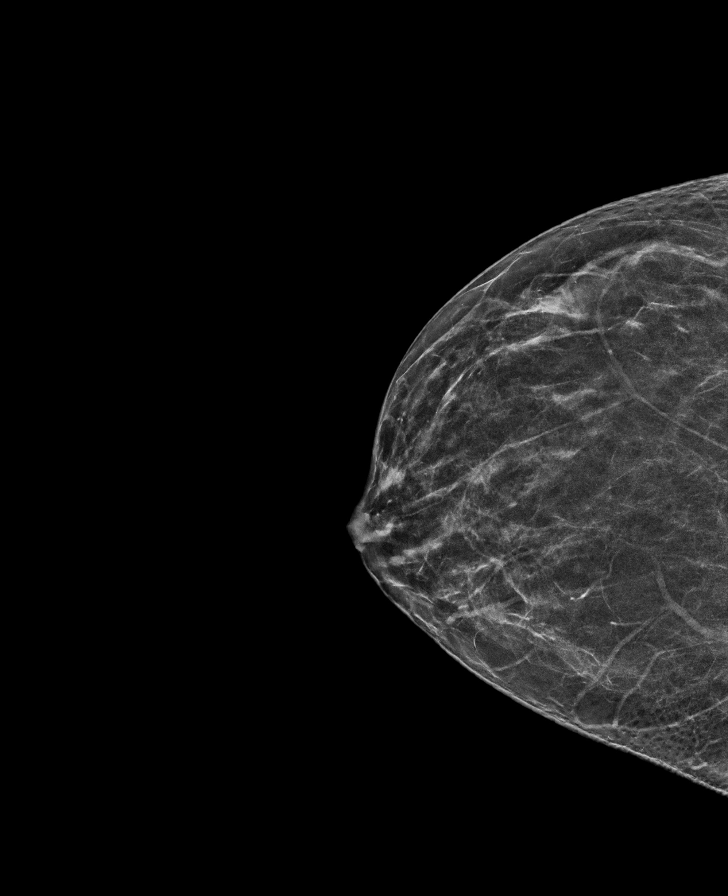

[R CC tomo · tomo slice 28/55.0]
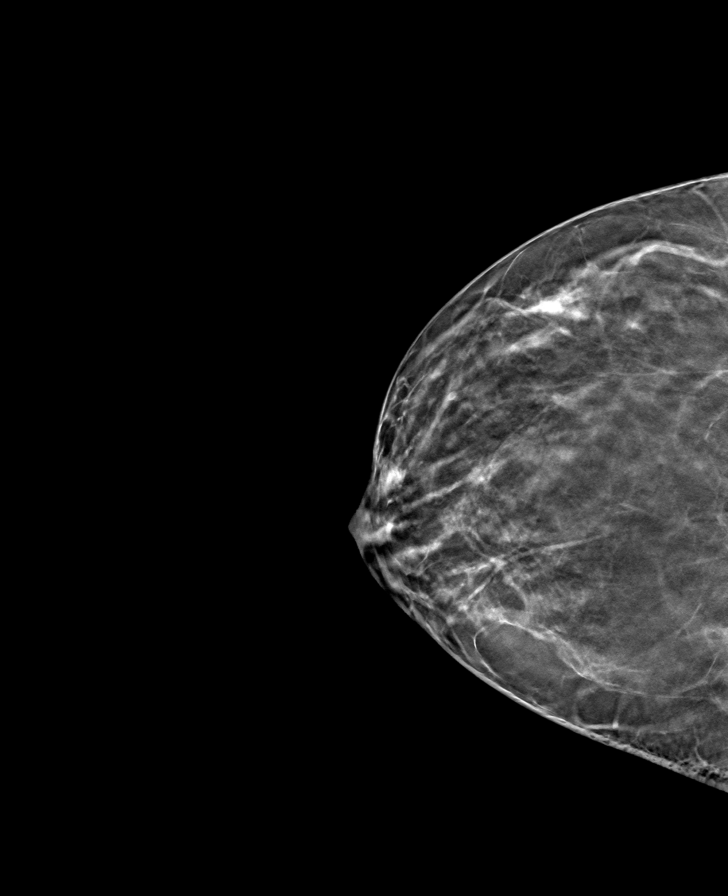

[L CC tomo · tomo slice 30/59.0]
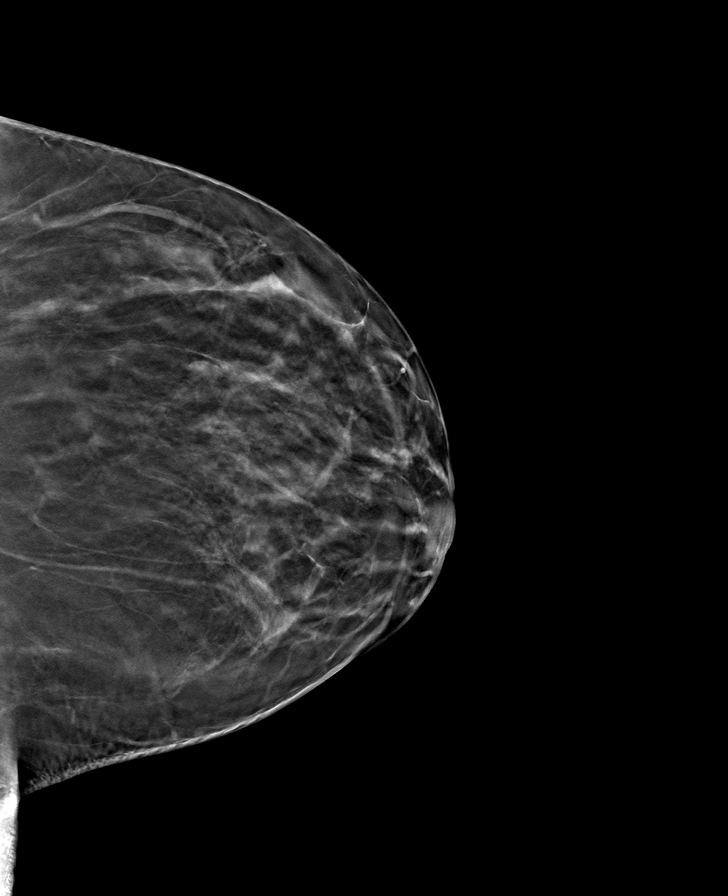

[R MLO tomo · tomo slice 38/75.0]
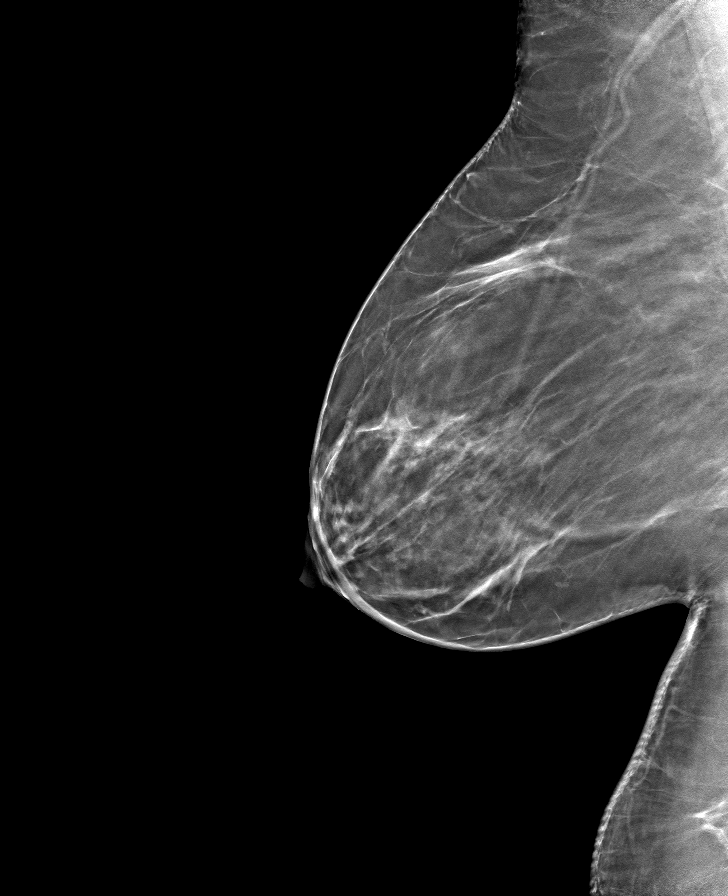

[L MLO tomo · tomo slice 37/73.0]
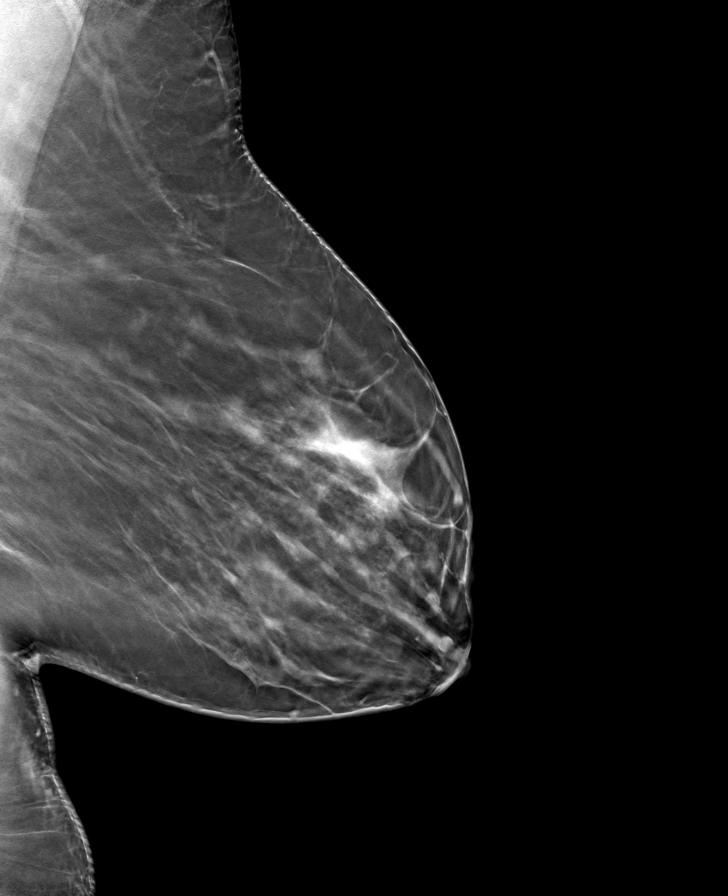

[8 of 24 positions shown; findings below may reference images not displayed]

ACR Breast Density Category b: There are scattered areas of
fibroglandular density.
FINDINGS: Mammographically, there are no new suspicious masses, areas of
architectural distortion or microcalcifications in the left breast.
The previously seen low-density benign-appearing mass in the left
breast slightly upper outer quadrant, middle depth, has decreased in
size.

Mammographic images were processed with CAD.

On physical exam, no suspicious masses are palpated.

Targeted ultrasound is performed, showing decreased in size
benign-appearing hypoechoic circumscribed mass in the left breast 3
o'clock 9 cm from the nipple measuring 3-4 mm.
IMPRESSION: Decreased in size benign-appearing left breast 3 o'clock mass. The
decrease in size confirms benign nature. No specific imaging
follow-up is recommended.

RECOMMENDATION:
Screening mammogram in one year.(Code:[id number])

I have discussed the findings and recommendations with the patient.
Results were also provided in writing at the conclusion of the
visit. If applicable, a reminder letter will be sent to the patient
regarding the next appointment.

BI-RADS CATEGORY  2: Benign.

## 2020-03-23 ENCOUNTER — Other Ambulatory Visit: Payer: Self-pay | Admitting: Obstetrics & Gynecology

## 2022-03-13 ENCOUNTER — Other Ambulatory Visit: Payer: Self-pay | Admitting: Internal Medicine

## 2022-03-13 ENCOUNTER — Other Ambulatory Visit: Payer: Self-pay | Admitting: Obstetrics & Gynecology

## 2022-03-13 DIAGNOSIS — Z1231 Encounter for screening mammogram for malignant neoplasm of breast: Secondary | ICD-10-CM

## 2022-03-26 LAB — COLOGUARD: COLOGUARD: NEGATIVE

## 2022-04-10 ENCOUNTER — Ambulatory Visit
Admission: RE | Admit: 2022-04-10 | Discharge: 2022-04-10 | Disposition: A | Discharge status: Home / Self Care | Payer: BC Managed Care – PPO | Source: Ambulatory Visit | Attending: Internal Medicine | Admitting: Internal Medicine

## 2022-04-10 DIAGNOSIS — Z1231 Encounter for screening mammogram for malignant neoplasm of breast: Secondary | ICD-10-CM | POA: Diagnosis present

## 2023-03-26 ENCOUNTER — Other Ambulatory Visit: Payer: Self-pay | Admitting: Internal Medicine

## 2023-03-26 DIAGNOSIS — Z1231 Encounter for screening mammogram for malignant neoplasm of breast: Secondary | ICD-10-CM

## 2023-04-16 ENCOUNTER — Ambulatory Visit
Admission: RE | Admit: 2023-04-16 | Discharge: 2023-04-16 | Disposition: A | Discharge status: Home / Self Care | Payer: BC Managed Care – PPO | Source: Ambulatory Visit | Attending: Internal Medicine | Admitting: Internal Medicine

## 2023-04-16 DIAGNOSIS — Z1231 Encounter for screening mammogram for malignant neoplasm of breast: Secondary | ICD-10-CM | POA: Diagnosis present

## 2024-05-05 ENCOUNTER — Other Ambulatory Visit: Payer: Self-pay | Admitting: Internal Medicine

## 2024-05-05 DIAGNOSIS — Z1231 Encounter for screening mammogram for malignant neoplasm of breast: Secondary | ICD-10-CM

## 2024-06-08 ENCOUNTER — Ambulatory Visit

## 2024-06-25 ENCOUNTER — Ambulatory Visit
Admission: RE | Admit: 2024-06-25 | Discharge: 2024-06-25 | Disposition: A | Discharge status: Home / Self Care | Source: Ambulatory Visit | Attending: Internal Medicine | Admitting: Internal Medicine

## 2024-06-25 DIAGNOSIS — Z1231 Encounter for screening mammogram for malignant neoplasm of breast: Secondary | ICD-10-CM | POA: Diagnosis present
